# Patient Record
Sex: Female | Born: 1970 | Race: White | Hispanic: No | Marital: Married | Smoking: Never smoker
Health system: Southern US, Community
[De-identification: ages and names within clinical notes are randomized; demographics above are authoritative.]

## PROBLEM LIST (undated history)

## (undated) DIAGNOSIS — N8000 Endometriosis of the uterus, unspecified: Secondary | ICD-10-CM

## (undated) DIAGNOSIS — L409 Psoriasis, unspecified: Secondary | ICD-10-CM

## (undated) DIAGNOSIS — F419 Anxiety disorder, unspecified: Secondary | ICD-10-CM

## (undated) HISTORY — PX: ABDOMINAL HYSTERECTOMY: SHX81

## (undated) HISTORY — DX: Psoriasis, unspecified: L40.9

## (undated) HISTORY — DX: Endometriosis of uterus: N80.0

## (undated) HISTORY — DX: Anxiety disorder, unspecified: F41.9

## (undated) HISTORY — DX: Endometriosis of the uterus, unspecified: N80.00

---

## 2004-01-11 ENCOUNTER — Ambulatory Visit: Payer: Self-pay | Admitting: Unknown Physician Specialty

## 2005-08-11 ENCOUNTER — Ambulatory Visit: Payer: Self-pay | Admitting: Gynecology

## 2005-09-07 ENCOUNTER — Ambulatory Visit: Payer: Self-pay | Admitting: Unknown Physician Specialty

## 2005-10-14 ENCOUNTER — Ambulatory Visit: Payer: Self-pay | Admitting: Unknown Physician Specialty

## 2006-08-25 ENCOUNTER — Observation Stay: Payer: Self-pay

## 2006-09-03 ENCOUNTER — Inpatient Hospital Stay: Payer: Self-pay | Admitting: Obstetrics and Gynecology

## 2007-11-23 ENCOUNTER — Observation Stay: Payer: Self-pay | Admitting: Cardiovascular Disease

## 2009-10-14 ENCOUNTER — Ambulatory Visit: Payer: Self-pay | Admitting: Unknown Physician Specialty

## 2009-10-15 ENCOUNTER — Ambulatory Visit: Payer: Self-pay | Admitting: Unknown Physician Specialty

## 2009-10-17 LAB — PATHOLOGY REPORT

## 2012-04-15 ENCOUNTER — Ambulatory Visit: Payer: Self-pay | Admitting: General Surgery

## 2012-04-26 ENCOUNTER — Ambulatory Visit: Payer: Self-pay | Admitting: Family Medicine

## 2014-06-29 NOTE — Op Note (Signed)
PATIENT NAME:  Rose Young, [name] MR#:  [id number] DATE OF BIRTH:  12/05/1970  DATE OF PROCEDURE:  04/15/2012  PREOPERATIVE DIAGNOSES:  1.  Umbilical hernia.  2.  Left lower quadrant abdominal wall mass, possible hernia.   POSTOPERATIVE DIAGNOSES: 1.  Umbilical hernia.  2.  Intramuscular mass in the left lower quadrant abdominal wall suspicious for endometriosis.   OPERATION PERFORMED:  Laparoscopy, repair of umbilical hernia and excision of abdominal wall mass.   SURGEON:  Kathreen CosierS. G. Sankar, M.D.   ANESTHESIA:  General.   COMPLICATIONS:  None.   ESTIMATED BLOOD LOSS:  Minimal.   DRAINS:  None.   DESCRIPTION OF PROCEDURE:  The patient was put to sleep in the supine position on the operating table.  The abdomen was prepped and draped out as a sterile field.  The patient had a less than a fingerbreadth size umbilical defect with a small hernial protrusion.  A skin incision was made in the inferior lip of the umbilicus and dissected down to expose the small fascial opening.  Through this the Veress needle was positioned and pneumoperitoneum was obtained after verification of the hanging drop method.  A 10 mm port was placed and a camera was introduced.  Visualization of the left lower quadrant showed the previous port site area where the mass was palpated and a slight bulge into the peritoneal surface, but no hernia of any kind was noted.  All the other port sites in the lower abdomen were inspected and showed no evidence of hernias and there was no grossly abnormal findings in the lower abdominal region.  A 5 mm port was placed in the right lower quadrant and using a suture passer the umbilical port site was closed with a 0 Prolene stitch to complete repair of this hernia after removal of the port.  Pneumoperitoneum was released and the remaining port was removed.  Skin incisions of both these sites were closed with subcuticular 4-0 Vicryl.  Overlying the palpable mass an oblique incision along the  line of the external oblique fibers was made and deepened through the subcutaneous down to the external oblique.  The external oblique had to be opened along the line of its fibers and contained within the muscular tissue was an ill-defined firm 1.5 to 2 cm mass.  It also seemed to involve a small portion of the peritoneum.  This was excised out fully and noted to contain a few little chocolate-colored fluid in one place.  The peritoneum was closed with running 2-0 Vicryl and the external oblique closed with interrupted 0 Prolene stitches.  The wound was irrigated and closed.  The subcutaneous tissue was closed with 3-0 Vicryl and the skin with subcuticular 4-0 Vicryl, covered with Dermabond.  The procedure was well-tolerated.  She was subsequently returned to the recovery room in stable condition.    ____________________________ S.Wynona LunaG. Sankar, MD sgs:ea D: 04/15/2012 13:30:09 ET T: 04/16/2012 01:25:42 ET JOB#: [id number]  cc: Timoteo ExposeS.G. Evette CristalSankar, MD, <Dictator> [facility] Wynona LunaG SANKAR MD ELECTRONICALLY SIGNED 04/22/2012 11:09

## 2015-02-21 ENCOUNTER — Emergency Department: Payer: Managed Care, Other (non HMO)

## 2015-02-21 ENCOUNTER — Emergency Department
Admission: EM | Admit: 2015-02-21 | Discharge: 2015-02-21 | Disposition: A | Discharge status: Home / Self Care | Payer: Managed Care, Other (non HMO) | Attending: Obstetrics & Gynecology | Admitting: Obstetrics & Gynecology

## 2015-02-21 ENCOUNTER — Encounter: Payer: Self-pay | Admitting: Emergency Medicine

## 2015-02-21 DIAGNOSIS — R1030 Lower abdominal pain, unspecified: Secondary | ICD-10-CM | POA: Diagnosis present

## 2015-02-21 DIAGNOSIS — R1084 Generalized abdominal pain: Secondary | ICD-10-CM | POA: Diagnosis not present

## 2015-02-21 DIAGNOSIS — Z3202 Encounter for pregnancy test, result negative: Secondary | ICD-10-CM | POA: Diagnosis not present

## 2015-02-21 DIAGNOSIS — R110 Nausea: Secondary | ICD-10-CM | POA: Diagnosis not present

## 2015-02-21 LAB — COMPREHENSIVE METABOLIC PANEL
ALBUMIN: 4.4 g/dL (ref 3.5–5.0)
ALT: 21 U/L (ref 14–54)
ANION GAP: 5 (ref 5–15)
AST: 19 U/L (ref 15–41)
Alkaline Phosphatase: 46 U/L (ref 38–126)
BUN: 12 mg/dL (ref 6–20)
CO2: 28 mmol/L (ref 22–32)
Calcium: 9.4 mg/dL (ref 8.9–10.3)
Chloride: 107 mmol/L (ref 101–111)
Creatinine, Ser: 0.82 mg/dL (ref 0.44–1.00)
GFR calc Af Amer: >60 mL/min (ref >60)
GFR calc non Af Amer: >60 mL/min (ref >60)
Glucose, Bld: 99 mg/dL (ref 65–99)
Potassium: 4 mmol/L (ref 3.5–5.1)
Sodium: 140 mmol/L (ref 135–145)
Total Bilirubin: 0.6 mg/dL (ref 0.3–1.2)
Total Protein: 6.6 g/dL (ref 6.5–8.1)

## 2015-02-21 LAB — CBC WITH DIFFERENTIAL/PLATELET
BASOS PCT: 1 %
Basophils Absolute: 0 10*3/uL (ref 0–0.1)
Eosinophils Absolute: 0.1 10*3/uL (ref 0–0.7)
Eosinophils Relative: 1 %
HCT: 43.4 % (ref 35.0–47.0)
HEMOGLOBIN: 14.6 g/dL (ref 12.0–16.0)
LYMPHS PCT: 25 %
Lymphs Abs: 1.8 10*3/uL (ref 1.0–3.6)
MCH: 31.2 pg (ref 26.0–34.0)
MCHC: 33.6 g/dL (ref 32.0–36.0)
MCV: 92.7 fL (ref 80.0–100.0)
MONOS PCT: 7 %
Monocytes Absolute: 0.5 10*3/uL (ref 0.2–0.9)
NEUTROS PCT: 66 %
Neutro Abs: 4.8 10*3/uL (ref 1.4–6.5)
Platelets: 240 10*3/uL (ref 150–440)
RBC: 4.68 MIL/uL (ref 3.80–5.20)
RDW: 12.9 % (ref 11.5–14.5)
WBC: 7.2 10*3/uL (ref 3.6–11.0)

## 2015-02-21 LAB — PREGNANCY, URINE: Preg Test, Ur: NEGATIVE

## 2015-02-21 LAB — URINALYSIS COMPLETE WITH MICROSCOPIC (ARMC ONLY)
BILIRUBIN URINE: NEGATIVE
Glucose, UA: NEGATIVE mg/dL
HGB URINE DIPSTICK: NEGATIVE
Ketones, ur: NEGATIVE mg/dL
LEUKOCYTES UA: NEGATIVE
NITRITE: NEGATIVE
PH: 6 (ref 5.0–8.0)
Protein, ur: NEGATIVE mg/dL
Specific Gravity, Urine: 1.012 (ref 1.005–1.030)
WBC UA: NONE SEEN WBC/hpf (ref 0–5)

## 2015-02-21 LAB — LIPASE, BLOOD: Lipase: 23 U/L (ref 11–51)

## 2015-02-21 MED ORDER — ONDANSETRON HCL 4 MG/2ML IJ SOLN
4.0000 mg | Freq: Once | INTRAMUSCULAR | Status: AC
  Administered 2015-02-21: 4 mg via INTRAVENOUS
  Filled 2015-02-21: qty 2

## 2015-02-21 MED ORDER — IOHEXOL 240 MG/ML SOLN
25.0000 mL | Freq: Once | INTRAMUSCULAR | Status: AC | PRN
  Administered 2015-02-21: 25 mL via ORAL

## 2015-02-21 MED ORDER — OXYCODONE-ACETAMINOPHEN 5-325 MG PO TABS: 1.0000 | ORAL_TABLET | Freq: Four times a day (QID) | ORAL | Status: DC | PRN

## 2015-02-21 MED ORDER — MORPHINE SULFATE (PF) 4 MG/ML IV SOLN
4.0000 mg | Freq: Once | INTRAVENOUS | Status: AC
  Administered 2015-02-21: 4 mg via INTRAVENOUS
  Filled 2015-02-21: qty 1

## 2015-02-21 MED ORDER — SODIUM CHLORIDE 0.9 % IV BOLUS (SEPSIS)
1000.0000 mL | Freq: Once | INTRAVENOUS | Status: AC
  Administered 2015-02-21: 1000 mL via INTRAVENOUS

## 2015-02-21 MED ORDER — IOHEXOL 300 MG/ML  SOLN
100.0000 mL | Freq: Once | INTRAMUSCULAR | Status: AC | PRN
  Administered 2015-02-21: 100 mL via INTRAVENOUS

## 2015-02-21 NOTE — ED Notes (Signed)
Pt to ED with c/o lower abd. Pain x 6 weeks that has been getting worse the last couple of days, seen by Dr. Tiburcio PeaHarris OBGYN last week and had US preformed, states pain has gotten worse and he advised for her to come to ED

## 2015-02-21 NOTE — Discharge Instructions (Signed)

## 2015-02-21 NOTE — ED Provider Notes (Signed)
[facility] Emergency Department Provider Note  Time seen: 3:16 PM  I have reviewed the triage vital signs and the nursing notes.   HISTORY  Chief Complaint Abdominal Pain    HPI Rose Young is a 44 y.o. female with no past medical history presents the emergency department with 6 weeks of lower abdominal pain. According to the patient for the past 6 weeks she has been experiencing mid to lower abdominal pain. She has been following up with Dr. Tiburcio Pea at [facility]. She has had several examinations and a transvaginal ultrasound which have not showed any concerning results. Patient states the pain has increased considerably over the last 2-3 days, she called Dr. Tiburcio Pea today who referred her to the emergency department for a "CT scan." Patient rates the pain currently is a 5/10, states it is a dull aching pain around the bellybutton and lower abdomen. Denies any dysuria, vaginal bleeding, diarrhea, black or bloody stool. She does state nausea but denies vomiting. Patient is status post hysterectomy 3 years ago. States normal bowel movements, denies constipation or diarrhea.     History reviewed. No pertinent past medical history.  There are no active problems to display for this patient.   Past Surgical History  Procedure Laterality Date  . Abdominal hysterectomy      No current outpatient prescriptions on file.  Allergies Review of patient's allergies indicates no known allergies.  No family history on file.  Social History Social History  Substance Use Topics  . Smoking status: Never Smoker   . Smokeless tobacco: None  . Alcohol Use: Yes     Comment: occassional     Review of Systems Constitutional: Negative for fever. Cardiovascular: Negative for chest pain. Respiratory: Negative for shortness of breath. Gastrointestinal: Positive lower abdominal pain, nausea. Negative for vomiting or diarrhea. Negative for black or bloody  stool. Genitourinary: Negative for dysuria. Negative for hematuria.  Musculoskeletal: Negative for back pain. Skin: Negative for rash. Neurological: Negative for headache 10-point ROS otherwise negative.  ____________________________________________   PHYSICAL EXAM:  VITAL SIGNS: ED Triage Vitals  Enc Vitals Group     BP 02/21/15 1353 152/94 mmHg     Pulse Rate 02/21/15 1353 96     Resp 02/21/15 1353 18     Temp 02/21/15 1353 98 F (36.7 C)     Temp Source 02/21/15 1353 Oral     SpO2 02/21/15 1353 100 %     Weight 02/21/15 1353 135 lb (61.236 kg)     Height 02/21/15 1353  (1.676 m)     Head Cir --      Peak Flow --      Pain Score 02/21/15 1353 6     Pain Loc --      Pain Edu? --      Excl. in GC? --     Constitutional: Alert and oriented. Well appearing and in no distress. Eyes: Normal exam ENT   Head: Normocephalic and atraumatic.   Mouth/Throat: Mucous membranes are moist. Cardiovascular: Normal rate, regular rhythm. No murmur Respiratory: Normal respiratory effort without tachypnea nor retractions. Breath sounds are clear and equal bilaterally. No wheezes/rales/rhonchi. Gastrointestinal: Soft, mild diffuse lower abdominal tenderness to palpation, no rebound or guarding. No distention. No CVA tenderness. Musculoskeletal: Nontender with normal range of motion in all extremities. Neurologic:  Normal speech and language. No gross focal neurologic deficits  Skin:  Skin is warm, dry and intact.  Psychiatric: Mood and affect are normal. Speech  and behavior are normal.   ____________________________________________     RADIOLOGY  CT scan was within normal limits  ____________________________________________   INITIAL IMPRESSION / ASSESSMENT AND PLAN / ED COURSE  Pertinent labs & imaging results that were available during my care of the patient were reviewed by me and considered in my medical decision making (see chart for details).  Patient presents  the emergency department 6 weeks of lower abdominal pain, worse in the past 2-3 days. She has been seen by OB every morning with multiple exams including an ultrasound showing no concerning findings. She called Dr. Tiburcio PeaHarris was referred to the emergency department today for a CAT scan. Patient's labs are largely within normal limits. We'll proceed with a CT abdomen/pelvis, and discussed the results with Dr. Tiburcio PeaHarris once available. Patient currently rates her pain as a 5/10, we will treat with pain medication and nausea medication, and closely monitor in the emergency department.  Labs and CT abdomen/pelvis are largely within normal limits. I discussed the findings with Dr. Tiburcio PeaHarris of OB/GYN, they will plan for exploratory laparoscopy in the near future. Patient is aware, and has a follow-up appointment on Tuesday with [facility]. ____________________________________________   FINAL CLINICAL IMPRESSION(S) / ED DIAGNOSES  Abdominal pain   Minna AntisKevin Love Chowning, MD 02/21/15 [phone]

## 2015-10-09 ENCOUNTER — Other Ambulatory Visit: Payer: Self-pay | Admitting: Obstetrics and Gynecology

## 2015-10-09 DIAGNOSIS — R920 Mammographic microcalcification found on diagnostic imaging of breast: Secondary | ICD-10-CM

## 2015-10-24 ENCOUNTER — Ambulatory Visit
Admission: RE | Admit: 2015-10-24 | Discharge: 2015-10-24 | Disposition: A | Discharge status: Home / Self Care | Payer: Managed Care, Other (non HMO) | Source: Ambulatory Visit | Attending: Obstetrics and Gynecology | Admitting: Obstetrics and Gynecology

## 2015-10-24 DIAGNOSIS — R920 Mammographic microcalcification found on diagnostic imaging of breast: Secondary | ICD-10-CM | POA: Diagnosis not present

## 2015-10-24 DIAGNOSIS — N63 Unspecified lump in breast: Secondary | ICD-10-CM | POA: Diagnosis not present

## 2015-10-24 DIAGNOSIS — R921 Mammographic calcification found on diagnostic imaging of breast: Secondary | ICD-10-CM | POA: Diagnosis not present

## 2015-11-04 ENCOUNTER — Other Ambulatory Visit: Payer: Self-pay | Admitting: Obstetrics and Gynecology

## 2015-11-04 DIAGNOSIS — R920 Mammographic microcalcification found on diagnostic imaging of breast: Secondary | ICD-10-CM

## 2015-11-04 DIAGNOSIS — N6310 Unspecified lump in the right breast, unspecified quadrant: Secondary | ICD-10-CM

## 2016-04-27 ENCOUNTER — Other Ambulatory Visit: Payer: Managed Care, Other (non HMO)

## 2016-04-27 ENCOUNTER — Ambulatory Visit: Admission: RE | Admit: 2016-04-27 | Payer: Managed Care, Other (non HMO) | Source: Ambulatory Visit

## 2016-05-31 ENCOUNTER — Other Ambulatory Visit: Payer: Self-pay | Admitting: Obstetrics and Gynecology

## 2017-04-19 ENCOUNTER — Other Ambulatory Visit: Payer: Self-pay | Admitting: Obstetrics and Gynecology

## 2017-04-19 ENCOUNTER — Telehealth: Payer: Self-pay | Admitting: Obstetrics and Gynecology

## 2017-04-19 DIAGNOSIS — Z1239 Encounter for other screening for malignant neoplasm of breast: Secondary | ICD-10-CM

## 2017-04-19 DIAGNOSIS — R928 Other abnormal and inconclusive findings on diagnostic imaging of breast: Secondary | ICD-10-CM

## 2017-04-19 NOTE — Telephone Encounter (Signed)
RN to notify pt that Rose Young is sched mammo and u/s and will f/u with appt time due to dx not screening mammo.

## 2017-04-19 NOTE — Progress Notes (Signed)
Pt due for dx mammo and RT breast u/s as f/u from 8/17. Nancy to sched and contact pt.

## 2017-04-19 NOTE — Progress Notes (Signed)
Patient is scheduled for Friday, 04/30/17 @ 10:20am. No answer, v/m is full.

## 2017-04-19 NOTE — Telephone Encounter (Signed)
Patient is scheduled for Friday, 04/30/17 @ 10:20am. No answer, v/m is full. °

## 2017-04-19 NOTE — Telephone Encounter (Signed)
Pt is calling needing to speak with Rose Young about needing her mammogram  [place] [facility] . Please advise

## 2017-04-20 NOTE — Telephone Encounter (Signed)
Patient is aware of appointment and location. [facility] phone# was given if she needs to reschedule. Patient notified her v/m is full.

## 2017-04-30 ENCOUNTER — Ambulatory Visit
Admission: RE | Admit: 2017-04-30 | Discharge: 2017-04-30 | Disposition: A | Discharge status: Home / Self Care | Payer: 59 | Source: Ambulatory Visit | Attending: Obstetrics and Gynecology | Admitting: Obstetrics and Gynecology

## 2017-04-30 ENCOUNTER — Encounter: Payer: Self-pay | Admitting: Obstetrics and Gynecology

## 2017-04-30 DIAGNOSIS — R928 Other abnormal and inconclusive findings on diagnostic imaging of breast: Secondary | ICD-10-CM | POA: Diagnosis present

## 2017-04-30 DIAGNOSIS — Z1231 Encounter for screening mammogram for malignant neoplasm of breast: Secondary | ICD-10-CM | POA: Insufficient documentation

## 2017-04-30 DIAGNOSIS — R921 Mammographic calcification found on diagnostic imaging of breast: Secondary | ICD-10-CM | POA: Insufficient documentation

## 2017-04-30 DIAGNOSIS — Z1239 Encounter for other screening for malignant neoplasm of breast: Secondary | ICD-10-CM

## 2018-10-30 NOTE — Progress Notes (Signed)
Patient, No Pcp Per   Chief Complaint  Patient presents with  . Follow-up    Depression    HPI:      Ms. Rose Young is a 48 y.o. No obstetric history on file. who LMP was No LMP recorded. Patient has had a hysterectomy., presents today for anxiety/depression sx that have been worsening for the past 1 1/2 yrs. She is ready to try medication. Has FH depression and/or anxiety in mom and sisters. She has tried to "push through", exercising, eating well, etc, but she is very unhappy. Did lexapro in past for PP depression but it worked "too well" in that pt had flat affect. Pt with sleep disturbance, worry, irritability, feeling nervous, anhedonia. No SI. Sx now causing her to withdraw and avoid social events/friends. Has had 30# wt gain without diet/exercise change.  Current on annual at [facility] 12/19. Had normal thyroid labs at that time.   Past Medical History:  Diagnosis Date  . Anxiety disorder   . Endometriosis of uterus   . Psoriasis     Past Surgical History:  Procedure Laterality Date  . ABDOMINAL HYSTERECTOMY    . TONSILLECTOMY      Family History  Problem Relation Age of Onset  . Melanoma Maternal Grandfather        skin  . Prostate cancer Maternal Grandfather   . Melanoma Paternal Grandmother        skin  . Breast cancer Neg Hx     Social History   Socioeconomic History  . Marital status: Married    Spouse name: Not on file  . Number of children: Not on file  . Years of education: Not on file  . Highest education level: Not on file  Occupational History  . Not on file  Social Needs  . Financial resource strain: Not on file  . Food insecurity    Worry: Not on file    Inability: Not on file  . Transportation needs    Medical: Not on file    Non-medical: Not on file  Tobacco Use  . Smoking status: Never Smoker  . Smokeless tobacco: Never Used  Substance and Sexual Activity  . Alcohol use: Yes    Comment: occassional   . Drug use: Never  . Sexual  activity: Yes    Birth control/protection: Surgical    Comment: Hysterectomy  Lifestyle  . Physical activity    Days per week: Not on file    Minutes per session: Not on file  . Stress: Not on file  Relationships  . Social Herbalist on phone: Not on file    Gets together: Not on file    Attends religious service: Not on file    Active member of club or organization: Not on file    Attends meetings of clubs or organizations: Not on file    Relationship status: Not on file  . Intimate partner violence    Fear of current or ex partner: Not on file    Emotionally abused: Not on file    Physically abused: Not on file    Forced sexual activity: Not on file  Other Topics Concern  . Not on file  Social History Narrative  . Not on file    Outpatient Medications Prior to Visit  Medication Sig Dispense Refill  . clobetasol (TEMOVATE) 0.05 % external solution APP EXT AA BID UNTIL IMPROVED THEN PRF FLARES    . famciclovir (FAMVIR) 500  MG tablet TAKE 3 TABLETS BY MOUTH ONCE AT ONSET OF SYMPTOMS 30 tablet 1  . oxyCODONE-acetaminophen (ROXICET) 5-325 MG tablet Take 1 tablet by mouth every 6 (six) hours as needed. 20 tablet 0   No facility-administered medications prior to visit.       ROS:  Review of Systems  Constitutional: Negative for fatigue, fever and unexpected weight change.  Respiratory: Negative for cough, shortness of breath and wheezing.   Cardiovascular: Negative for chest pain, palpitations and leg swelling.  Gastrointestinal: Negative for blood in stool, constipation, diarrhea, nausea and vomiting.  Endocrine: Negative for cold intolerance, heat intolerance and polyuria.  Genitourinary: Negative for dyspareunia, dysuria, flank pain, frequency, genital sores, hematuria, menstrual problem, pelvic pain, urgency, vaginal bleeding, vaginal discharge and vaginal pain.  Musculoskeletal: Negative for back pain, joint swelling and myalgias.  Skin: Negative for rash.   Neurological: Negative for dizziness, syncope, light-headedness, numbness and headaches.  Hematological: Negative for adenopathy.  Psychiatric/Behavioral: Positive for agitation and dysphoric mood. Negative for confusion, sleep disturbance and suicidal ideas. The patient is not nervous/anxious.   BREAST: No symptoms   OBJECTIVE:   Vitals:  BP 120/90   Ht 5\' 6"  (1.676 m)   Wt 154 lb (69.9 kg)   BMI 24.86 kg/m   Physical Exam Vitals signs reviewed.  Constitutional:      Appearance: She is well-developed.  Neck:     Musculoskeletal: Normal range of motion.  Pulmonary:     Effort: Pulmonary effort is normal.  Musculoskeletal: Normal range of motion.  Skin:    General: Skin is warm and dry.  Neurological:     General: No focal deficit present.     Mental Status: She is alert and oriented to person, place, and time.     Cranial Nerves: No cranial nerve deficit.  Psychiatric:        Mood and Affect: Mood normal.        Behavior: Behavior normal.        Thought Content: Thought content normal.        Judgment: Judgment normal.     Results:  GAD 7 : Generalized Anxiety Score 10/31/2018  Nervous, Anxious, on Edge 3  Control/stop worrying 3  Worry too much - different things 3  Trouble relaxing 3  Restless 3  Easily annoyed or irritable 3  Afraid - awful might happen 1  Total GAD 7 Score 19  Anxiety Difficulty Extremely difficult     Depression screen PHQ 2/9 10/31/2018  Decreased Interest 2  Down, Depressed, Hopeless 3  PHQ - 2 Score 5  Altered sleeping 3  Tired, decreased energy 3  Change in appetite 1  Feeling bad or failure about yourself  3  Trouble concentrating 3  Moving slowly or fidgety/restless 3  Suicidal thoughts 0  PHQ-9 Score 21  Difficult doing work/chores Very difficult     Assessment/Plan: Anxiety and depression - Plan: LORazepam (ATIVAN) 0.5 MG tablet, sertraline (ZOLOFT) 50 MG tablet  Discussed therapy. Will try zoloft and Rx ativan as  rescue medication prn (use sparingly). Cont exercise. See if sx as well as sleep issues improve at 7 wk f/u/sooner prn. May need to add therapist depending on sx change.    Meds ordered this encounter  Medications  . LORazepam (ATIVAN) 0.5 MG tablet    Sig: Take 1 tablet (0.5 mg total) by mouth every 8 (eight) hours.    Dispense:  30 tablet    Refill:  0    Order  Specific Question:   Supervising Provider    Answer:   Nadara MustardHARRIS, ROBERT P [[id number]]  . sertraline (ZOLOFT) 50 MG tablet    Sig: Take 1 tablet (50 mg total) by mouth daily. Take 1/2 tab daily for 6 days, then 1 tab daily    Dispense:  30 tablet    Refill:  1    Order Specific Question:   Supervising Provider    Answer:   Nadara MustardHARRIS, ROBERT P [[id number]]      Return in about 7 weeks (around 12/19/2018) for depression f/u.  Mckoy Bhakta B. Ajanee Buren, PA-C 10/31/2018 2:25 PM

## 2018-10-31 ENCOUNTER — Encounter: Payer: Self-pay | Admitting: Obstetrics and Gynecology

## 2018-10-31 ENCOUNTER — Ambulatory Visit (INDEPENDENT_AMBULATORY_CARE_PROVIDER_SITE_OTHER): Payer: 59 | Admitting: Obstetrics and Gynecology

## 2018-10-31 ENCOUNTER — Other Ambulatory Visit: Payer: Self-pay

## 2018-10-31 VITALS — BP 120/90 | Ht 66.0 in | Wt 154.0 lb

## 2018-10-31 DIAGNOSIS — F419 Anxiety disorder, unspecified: Secondary | ICD-10-CM

## 2018-10-31 DIAGNOSIS — F32A Depression, unspecified: Secondary | ICD-10-CM

## 2018-10-31 DIAGNOSIS — F329 Major depressive disorder, single episode, unspecified: Secondary | ICD-10-CM

## 2018-10-31 MED ORDER — SERTRALINE HCL 50 MG PO TABS: 50.0000 mg | ORAL_TABLET | Freq: Every day | ORAL | 1 refills | Status: DC

## 2018-10-31 MED ORDER — LORAZEPAM 0.5 MG PO TABS
0.5000 mg | ORAL_TABLET | Freq: Three times a day (TID) | ORAL | 0 refills | Status: AC
Start: 2018-10-31 — End: ?

## 2018-10-31 NOTE — Patient Instructions (Signed)
I value your feedback and entrusting us with your care. If you get a [facility] patient survey, I would appreciate you taking the time to let us know about your experience today. Thank you! 

## 2018-12-26 ENCOUNTER — Other Ambulatory Visit: Payer: Self-pay | Admitting: Obstetrics and Gynecology

## 2018-12-26 DIAGNOSIS — F419 Anxiety disorder, unspecified: Secondary | ICD-10-CM

## 2018-12-26 DIAGNOSIS — F329 Major depressive disorder, single episode, unspecified: Secondary | ICD-10-CM

## 2018-12-26 DIAGNOSIS — F32A Depression, unspecified: Secondary | ICD-10-CM

## 2020-01-28 ENCOUNTER — Other Ambulatory Visit: Payer: Self-pay | Admitting: Obstetrics and Gynecology

## 2020-01-28 DIAGNOSIS — F32A Depression, unspecified: Secondary | ICD-10-CM

## 2020-01-28 DIAGNOSIS — F419 Anxiety disorder, unspecified: Secondary | ICD-10-CM

## 2020-01-28 MED ORDER — FLUOXETINE HCL 10 MG PO CAPS: 10.0000 mg | ORAL_CAPSULE | Freq: Every day | ORAL | 0 refills | Status: DC

## 2020-01-28 MED ORDER — FLUOXETINE HCL 20 MG PO CAPS: 20.0000 mg | ORAL_CAPSULE | Freq: Every day | ORAL | 1 refills | Status: DC

## 2020-01-28 NOTE — Progress Notes (Signed)
SRI change to prozac. Pt didn't feel herself on zoloft. Has anxiety/depression sx. RTO in 6 wks for f/u.

## 2020-04-29 ENCOUNTER — Other Ambulatory Visit: Payer: Self-pay | Admitting: Obstetrics and Gynecology

## 2020-05-07 ENCOUNTER — Other Ambulatory Visit: Payer: Self-pay | Admitting: Obstetrics and Gynecology

## 2020-05-07 DIAGNOSIS — F32A Depression, unspecified: Secondary | ICD-10-CM

## 2020-05-14 ENCOUNTER — Telehealth: Payer: Self-pay

## 2020-05-14 ENCOUNTER — Other Ambulatory Visit: Payer: Self-pay | Admitting: Obstetrics and Gynecology

## 2020-05-14 MED ORDER — FLUOXETINE HCL 20 MG PO CAPS: 20.0000 mg | ORAL_CAPSULE | Freq: Every day | ORAL | 0 refills | Status: AC

## 2020-05-14 NOTE — Telephone Encounter (Signed)
Pt calling; has requested prozac to be refilled before appt thru portal; hasn't heard anything.

## 2020-05-14 NOTE — Telephone Encounter (Signed)
Rx RF eRxd. Doing well with prozac 20 mg dose

## 2020-05-14 NOTE — Telephone Encounter (Signed)
Patient is scheduled for 06/11/20 with [facility] for annual. Patient is requesting refill to get to her scheduled appointment. Please advise

## 2020-05-14 NOTE — Progress Notes (Signed)
Rx RF prozac 20 mg till 4/22 annual. Doing well with Rx and dose

## 2020-05-31 ENCOUNTER — Other Ambulatory Visit: Payer: Self-pay | Admitting: Obstetrics and Gynecology

## 2020-05-31 DIAGNOSIS — F32A Depression, unspecified: Secondary | ICD-10-CM

## 2020-06-10 NOTE — Progress Notes (Deleted)
PCP: Patient, No Pcp Per (Inactive)   No chief complaint on file.   HPI:      Rose Young is a 50 y.o. [id number] whose LMP was No LMP recorded. Patient has had a hysterectomy., presents today for her annual examination.  Her menses are absent due to supracx hyst 2011 due to menorrhagia.  She {does:18564} have vasomotor sx.   Sex activity: {sex active:315163}. She {does:18564} have vaginal dryness.  Last Pap: {UXLK:[id number]}  Results were: {norm/abn:16707::"no abnormalities"} /neg HPV DNA.  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 There is no FH of breast cancer. There is no FH of ovarian cancer. The patient {does:18564} do self-breast exams.  Colonoscopy: {hx:15363}  Repeat due after 10*** years.   Tobacco use: {tob:20664} Alcohol use: {Alcohol:11675} Exercise: {exercise:31265}  She {does:18564} get adequate calcium and Vitamin D in her diet.  Labs with PCP.   Past Medical History:  Diagnosis Date  . Anxiety disorder   . Endometriosis of uterus   . Psoriasis     Past Surgical History:  Procedure Laterality Date  . ABDOMINAL HYSTERECTOMY    . TONSILLECTOMY      Family History  Problem Relation Age of Onset  . Melanoma Maternal Grandfather        skin  . Prostate cancer Maternal Grandfather   . Melanoma Paternal Grandmother        skin  . Breast cancer Neg Hx     Social History   Socioeconomic History  . Marital status: Married    Spouse name: Not on file  . Number of children: Not on file  . Years of education: Not on file  . Highest education level: Not on file  Occupational History  . Not on file  Tobacco Use  . Smoking status: Never Smoker  . Smokeless tobacco: Never Used  Vaping Use  . Vaping Use: Never used  Substance and Sexual Activity  . Alcohol use: Yes    Comment: occassional   . Drug use: Never  . Sexual activity: Yes     Birth control/protection: Surgical    Comment: Hysterectomy  Other Topics Concern  . Not on file  Social History Narrative  . Not on file   Social Determinants of Health   Financial Resource Strain: Not on file  Food Insecurity: Not on file  Transportation Needs: Not on file  Physical Activity: Not on file  Stress: Not on file  Social Connections: Not on file  Intimate Partner Violence: Not on file     Current Outpatient Medications:  .  clobetasol (TEMOVATE) 0.05 % external solution, APP EXT AA BID UNTIL IMPROVED THEN PRF FLARES, Disp: , Rfl:  .  famciclovir (FAMVIR) 500 MG tablet, TAKE 3 TABLETS BY MOUTH ONCE AT ONSET OF SYMPTOMS, Disp: 30 tablet, Rfl: 1 .  FLUoxetine (PROZAC) 20 MG capsule, Take 1 capsule (20 mg total) by mouth daily., Disp: 90 capsule, Rfl: 0 .  LORazepam (ATIVAN) 0.5 MG tablet, Take 1 tablet (0.5 mg total) by mouth every 8 (eight) hours., Disp: 30 tablet, Rfl: 0     ROS:  Review of Systems BREAST: No symptoms    Objective: There were no vitals taken for this visit.   OBGyn Exam  Results: No results found for this or any previous visit (from the past 24 hour(s)).  Assessment/Plan:  No diagnosis found.  No orders of the defined types were placed in this encounter.           GYN counsel {counseling:16159}    F/U  No follow-ups on file.  Patrecia Veiga B. Abbagayle Zaragoza, PA-C 06/10/2020 11:26 AM

## 2020-06-11 ENCOUNTER — Ambulatory Visit: Payer: 59 | Admitting: Obstetrics and Gynecology

## 2020-06-11 DIAGNOSIS — Z01419 Encounter for gynecological examination (general) (routine) without abnormal findings: Secondary | ICD-10-CM

## 2020-06-11 DIAGNOSIS — Z1151 Encounter for screening for human papillomavirus (HPV): Secondary | ICD-10-CM

## 2020-06-11 DIAGNOSIS — R928 Other abnormal and inconclusive findings on diagnostic imaging of breast: Secondary | ICD-10-CM

## 2020-06-11 DIAGNOSIS — Z124 Encounter for screening for malignant neoplasm of cervix: Secondary | ICD-10-CM

## 2020-06-11 DIAGNOSIS — F32A Depression, unspecified: Secondary | ICD-10-CM

## 2020-06-11 DIAGNOSIS — Z1231 Encounter for screening mammogram for malignant neoplasm of breast: Secondary | ICD-10-CM

## 2020-06-11 DIAGNOSIS — Z1211 Encounter for screening for malignant neoplasm of colon: Secondary | ICD-10-CM

## 2020-09-18 ENCOUNTER — Other Ambulatory Visit: Payer: Self-pay | Admitting: Obstetrics and Gynecology

## 2020-09-18 ENCOUNTER — Other Ambulatory Visit (HOSPITAL_COMMUNITY): Payer: Self-pay | Admitting: Obstetrics and Gynecology

## 2020-09-18 DIAGNOSIS — N6310 Unspecified lump in the right breast, unspecified quadrant: Secondary | ICD-10-CM

## 2020-09-18 DIAGNOSIS — R921 Mammographic calcification found on diagnostic imaging of breast: Secondary | ICD-10-CM

## 2020-09-20 ENCOUNTER — Inpatient Hospital Stay (HOSPITAL_COMMUNITY): Admission: RE | Admit: 2020-09-20 | Payer: 59 | Source: Ambulatory Visit

## 2020-09-20 ENCOUNTER — Encounter (HOSPITAL_COMMUNITY): Payer: Self-pay

## 2020-10-08 ENCOUNTER — Other Ambulatory Visit: Payer: Self-pay | Admitting: Obstetrics and Gynecology

## 2020-10-08 ENCOUNTER — Other Ambulatory Visit: Payer: Self-pay

## 2020-10-08 ENCOUNTER — Ambulatory Visit
Admission: RE | Admit: 2020-10-08 | Discharge: 2020-10-08 | Disposition: A | Discharge status: Home / Self Care | Payer: 59 | Source: Ambulatory Visit | Attending: Obstetrics and Gynecology | Admitting: Obstetrics and Gynecology

## 2020-10-08 DIAGNOSIS — N6310 Unspecified lump in the right breast, unspecified quadrant: Secondary | ICD-10-CM | POA: Insufficient documentation

## 2020-10-08 DIAGNOSIS — N6320 Unspecified lump in the left breast, unspecified quadrant: Secondary | ICD-10-CM

## 2022-12-22 IMAGING — MG DIGITAL DIAGNOSTIC BILAT W/ TOMO W/ CAD
8 of 15 series · 8 of 40 positions shown · non-contrast
Comparison: Previous exam(s).

CLINICAL DATA: 50-year-old female presenting for delayed follow-up
of a likely benign right breast mass.



[R MLO synth-2D (1 of 2)]
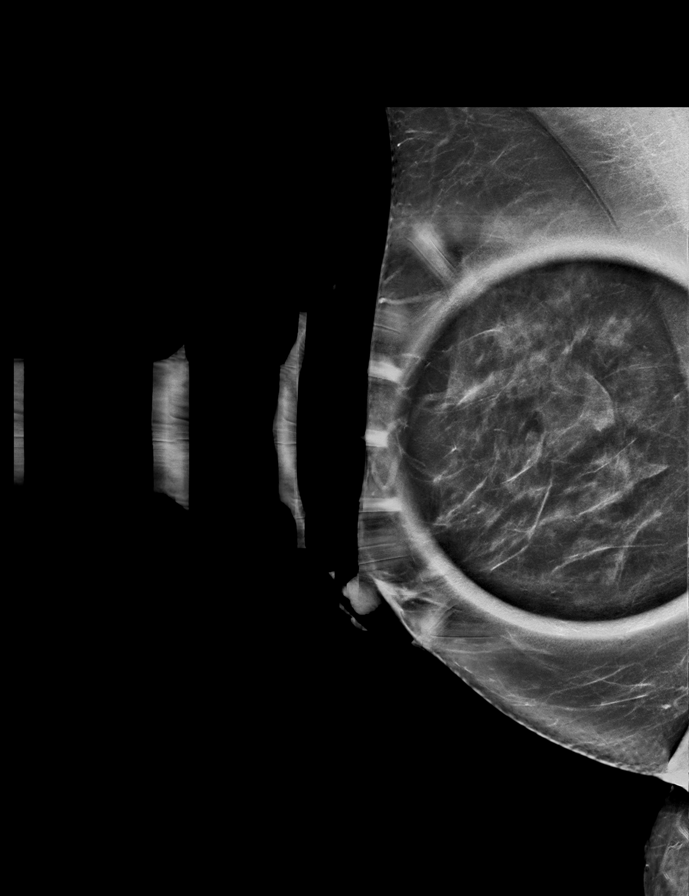

[R CC synth-2D (1 of 3)]
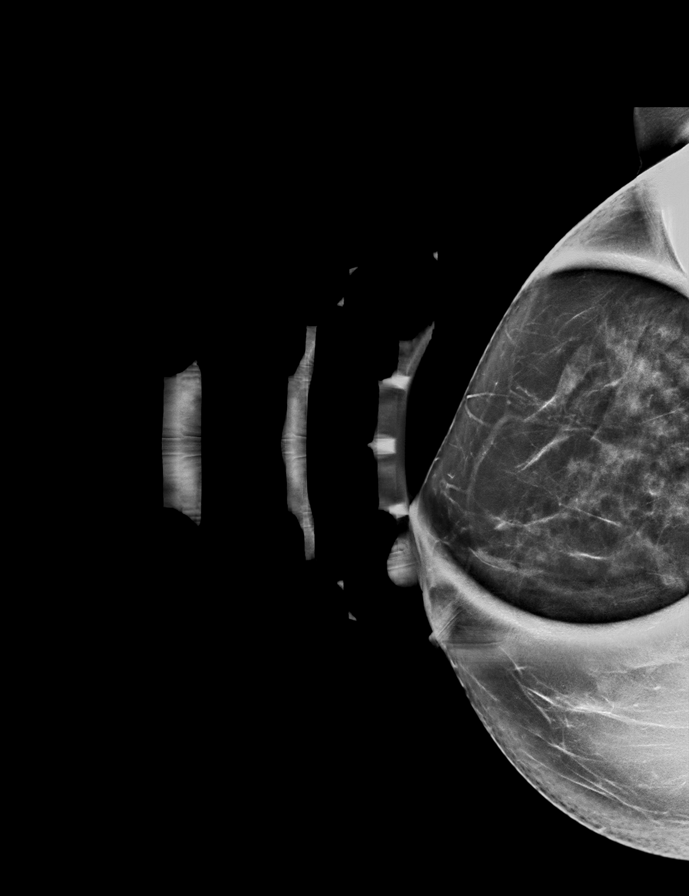

[R MLO synth-2D (2 of 2)]
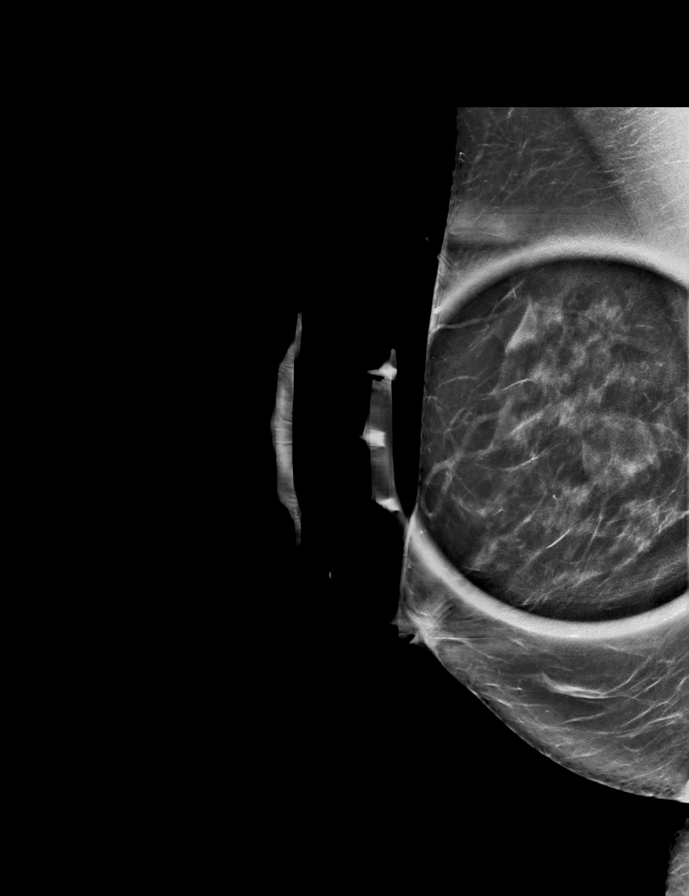

[L MLO synth-2D]
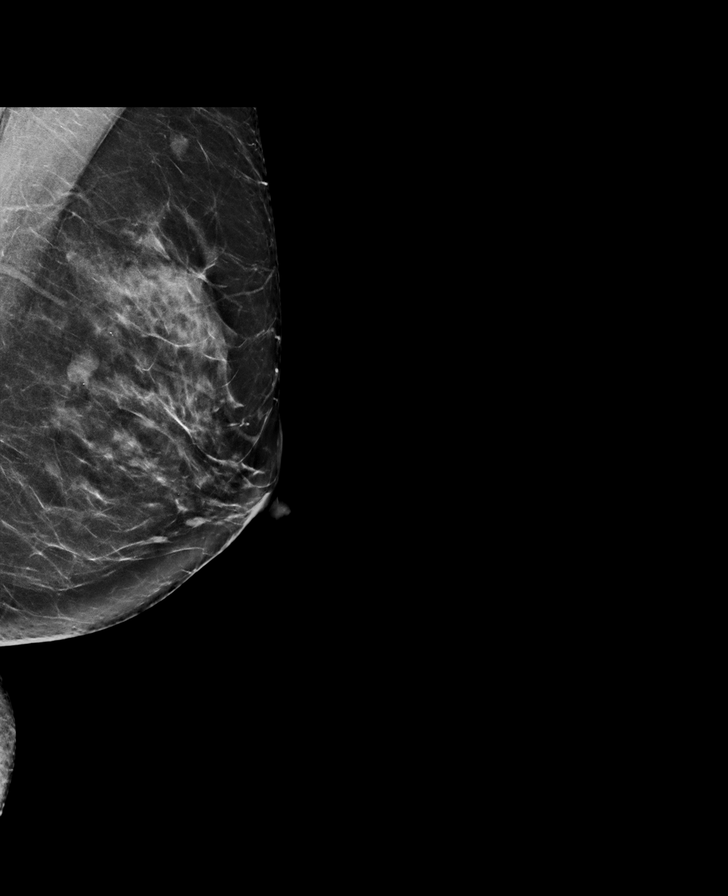

[L CC synth-2D]
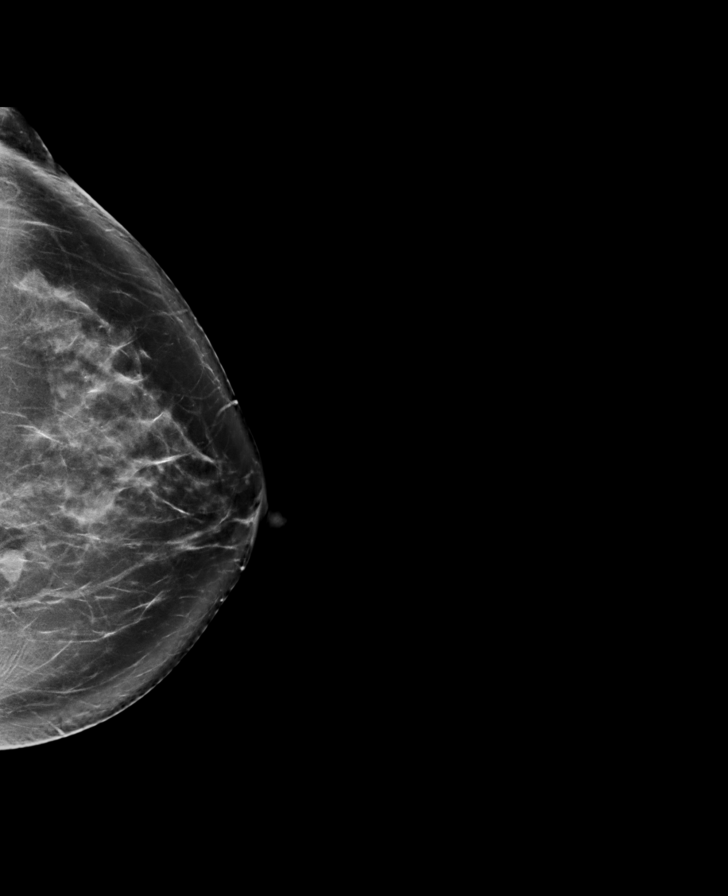

[R CC synth-2D (2 of 3)]
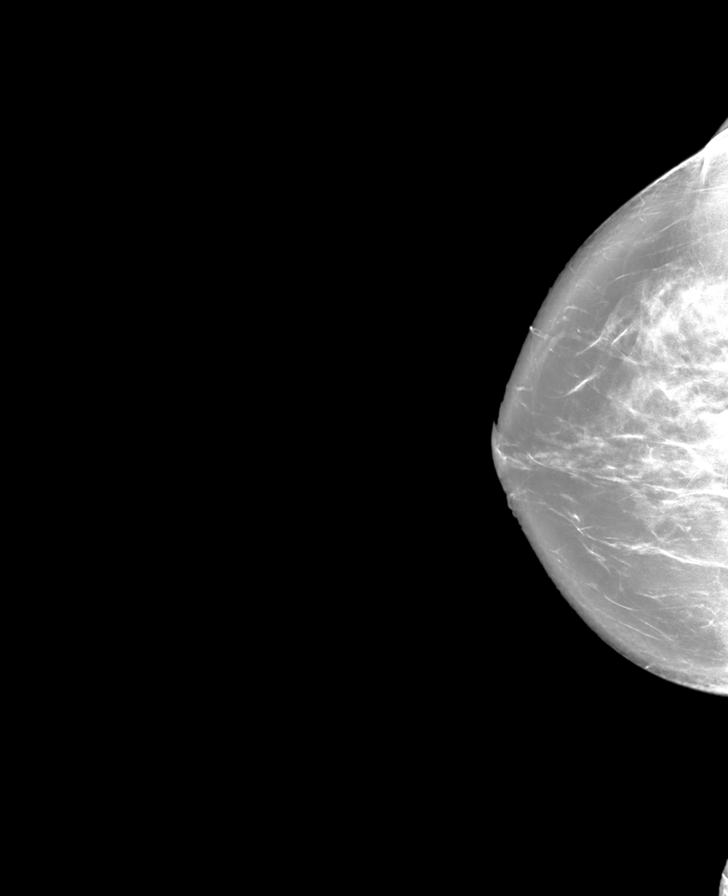

[R CC synth-2D (3 of 3)]
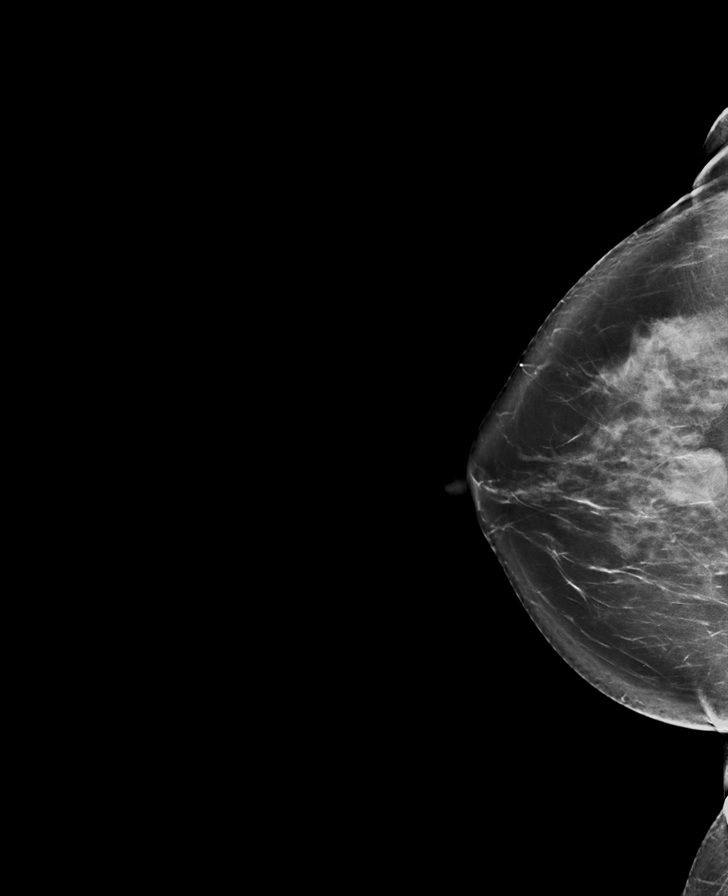

[R MLO tomo · tomo slice 55/81.0]
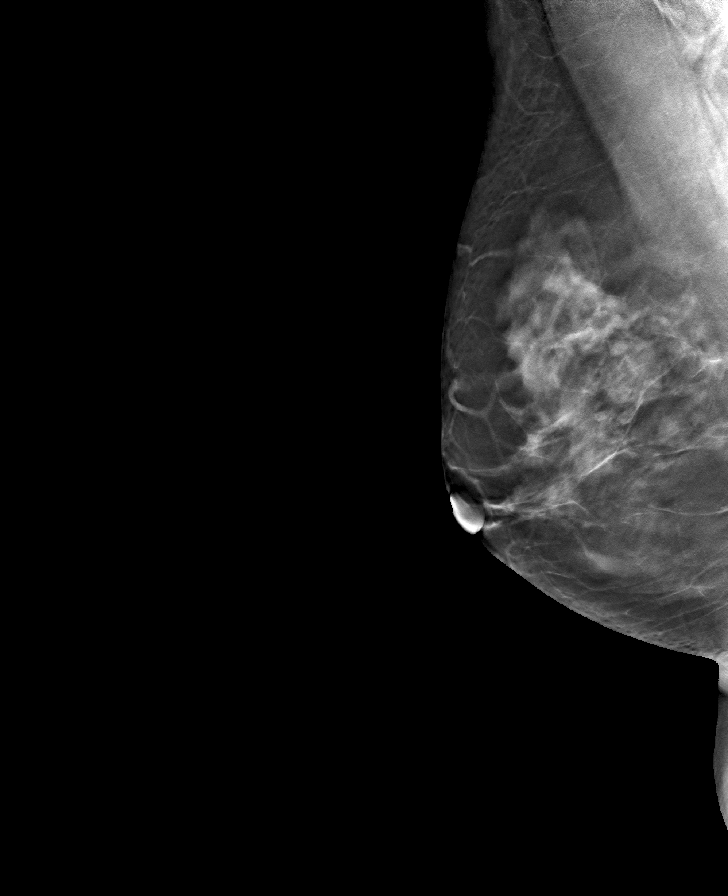

[8 of 40 positions shown; findings below may reference images not displayed]

ACR Breast Density Category c: The breast tissue is heterogeneously
dense, which may obscure small masses.
FINDINGS: In the central posterior right breast, there is a new 2.0 cm oval
circumscribed mass.

In the upper inner quadrant of the left breast, posterior depth
there is a new oval circumscribed mass measuring 0.9 cm.

No other suspicious calcifications, masses or areas of distortion
are seen in the bilateral breasts.

Ultrasound targeted to the right breast at 10 o'clock, 3 cm from the
nipple demonstrates a stable oval hypoechoic mass measuring 0.7 x
0.3 x 0.4 cm, previously measuring 0.7 x 0.3 x 0.4 cm.

The mass in the right breast at 10 o'clock, 2 cm from the nipple is
oval, circumscribed and anechoic measuring 2.0 cm.

Ultrasound targeted to the left breast at 10 o'clock, 4 cm from the
nipple there is an anechoic oval circumscribed mass measuring
cm.
IMPRESSION: 1. The right breast mass at 10 o'clock, 3 cm from the nipple has
demonstrated over 2 years of stability, and is therefore benign.

2.  There are new benign cysts in the bilateral breasts.

3.  No mammographic evidence of malignancy in the bilateral breasts.

RECOMMENDATION:
Screening mammogram in one year.(Code:QA-M-9M2)

I have discussed the findings and recommendations with the patient.
If applicable, a reminder letter will be sent to the patient
regarding the next appointment.

BI-RADS CATEGORY  2: Benign.

## 2023-10-06 ENCOUNTER — Other Ambulatory Visit: Payer: Self-pay | Admitting: Obstetrics and Gynecology

## 2023-10-06 DIAGNOSIS — Z1231 Encounter for screening mammogram for malignant neoplasm of breast: Secondary | ICD-10-CM

## 2023-10-07 ENCOUNTER — Ambulatory Visit
Admission: RE | Admit: 2023-10-07 | Discharge: 2023-10-07 | Disposition: A | Discharge status: Home / Self Care | Source: Ambulatory Visit

## 2023-10-07 DIAGNOSIS — Z1231 Encounter for screening mammogram for malignant neoplasm of breast: Secondary | ICD-10-CM

## 2023-10-12 ENCOUNTER — Ambulatory Visit: Payer: Self-pay | Admitting: Obstetrics and Gynecology
